# Patient Record
Sex: Female | Born: 1980 | Race: White | Hispanic: No | Smoking: Current every day smoker
Health system: Southern US, Community
[De-identification: ages and names within clinical notes are randomized; demographics above are authoritative.]

## PROBLEM LIST (undated history)

## (undated) DIAGNOSIS — M545 Low back pain: Principal | ICD-10-CM

## (undated) DIAGNOSIS — L409 Psoriasis, unspecified: Secondary | ICD-10-CM

## (undated) DIAGNOSIS — IMO0002 Reserved for concepts with insufficient information to code with codable children: Secondary | ICD-10-CM

## (undated) DIAGNOSIS — L4050 Arthropathic psoriasis, unspecified: Secondary | ICD-10-CM

## (undated) DIAGNOSIS — E669 Obesity, unspecified: Secondary | ICD-10-CM

## (undated) DIAGNOSIS — G8929 Other chronic pain: Principal | ICD-10-CM

## (undated) DIAGNOSIS — F319 Bipolar disorder, unspecified: Secondary | ICD-10-CM

## (undated) DIAGNOSIS — M4690 Unspecified inflammatory spondylopathy, site unspecified: Secondary | ICD-10-CM

## (undated) HISTORY — DX: Other chronic pain: G89.29

## (undated) HISTORY — DX: Bipolar disorder, unspecified: F31.9

## (undated) HISTORY — DX: Obesity, unspecified: E66.9

## (undated) HISTORY — PX: TONSILLECTOMY: SUR1361

## (undated) HISTORY — DX: Psoriasis, unspecified: L40.9

## (undated) HISTORY — DX: Low back pain: M54.5

---

## 2001-07-01 ENCOUNTER — Other Ambulatory Visit: Admission: RE | Admit: 2001-07-01 | Discharge: 2001-07-01 | Payer: Self-pay | Admitting: Obstetrics & Gynecology

## 2004-03-16 ENCOUNTER — Other Ambulatory Visit: Admission: RE | Admit: 2004-03-16 | Discharge: 2004-03-16 | Payer: Self-pay | Admitting: Obstetrics and Gynecology

## 2005-08-08 ENCOUNTER — Other Ambulatory Visit: Admission: RE | Admit: 2005-08-08 | Discharge: 2005-08-08 | Payer: Self-pay | Admitting: Obstetrics and Gynecology

## 2006-04-18 ENCOUNTER — Inpatient Hospital Stay (HOSPITAL_COMMUNITY): Admission: AD | Admit: 2006-04-18 | Discharge: 2006-04-18 | Payer: Self-pay | Admitting: Obstetrics and Gynecology

## 2006-05-22 ENCOUNTER — Inpatient Hospital Stay (HOSPITAL_COMMUNITY): Admission: AD | Admit: 2006-05-22 | Discharge: 2006-05-22 | Payer: Self-pay | Admitting: Obstetrics and Gynecology

## 2006-06-12 ENCOUNTER — Inpatient Hospital Stay (HOSPITAL_COMMUNITY): Admission: AD | Admit: 2006-06-12 | Discharge: 2006-06-17 | Payer: Self-pay | Admitting: Obstetrics and Gynecology

## 2006-06-14 ENCOUNTER — Encounter (INDEPENDENT_AMBULATORY_CARE_PROVIDER_SITE_OTHER): Payer: Self-pay | Admitting: Obstetrics and Gynecology

## 2006-10-09 ENCOUNTER — Emergency Department (HOSPITAL_COMMUNITY): Admission: EM | Admit: 2006-10-09 | Discharge: 2006-10-09 | Payer: Self-pay | Admitting: Emergency Medicine

## 2006-12-27 ENCOUNTER — Ambulatory Visit (HOSPITAL_COMMUNITY): Admission: RE | Admit: 2006-12-27 | Discharge: 2006-12-27 | Payer: Self-pay | Admitting: Obstetrics and Gynecology

## 2007-10-07 ENCOUNTER — Ambulatory Visit: Payer: Self-pay | Admitting: Obstetrics and Gynecology

## 2007-10-07 ENCOUNTER — Encounter: Payer: Self-pay | Admitting: Obstetrics and Gynecology

## 2007-10-23 ENCOUNTER — Ambulatory Visit: Payer: Self-pay | Admitting: Obstetrics & Gynecology

## 2007-12-04 ENCOUNTER — Ambulatory Visit: Payer: Self-pay | Admitting: Obstetrics & Gynecology

## 2008-06-13 IMAGING — US US TRANSVAGINAL NON-OB
1 series · 2 of 2 positions shown · non-contrast
Comparison: none

CLINICAL DATA: Evaluate for fibroids or polyps.  Status post delivery in Wednesday May, 2006 with irregular menses since.  LMP 08/29/06. 
 TRANSABDOMINAL AND TRANSVAGINAL PELVIC ULTRASOUND:
TECHNIQUE: Both transabdominal and transvaginal ultrasound examinations of the pelvis were performed including evaluation of the uterus, ovaries, adnexal regions, and pelvic cul-de-sac.

[Series 1: us transvaginal non-ob · 2 of 2 slices shown]
[im 1/2]
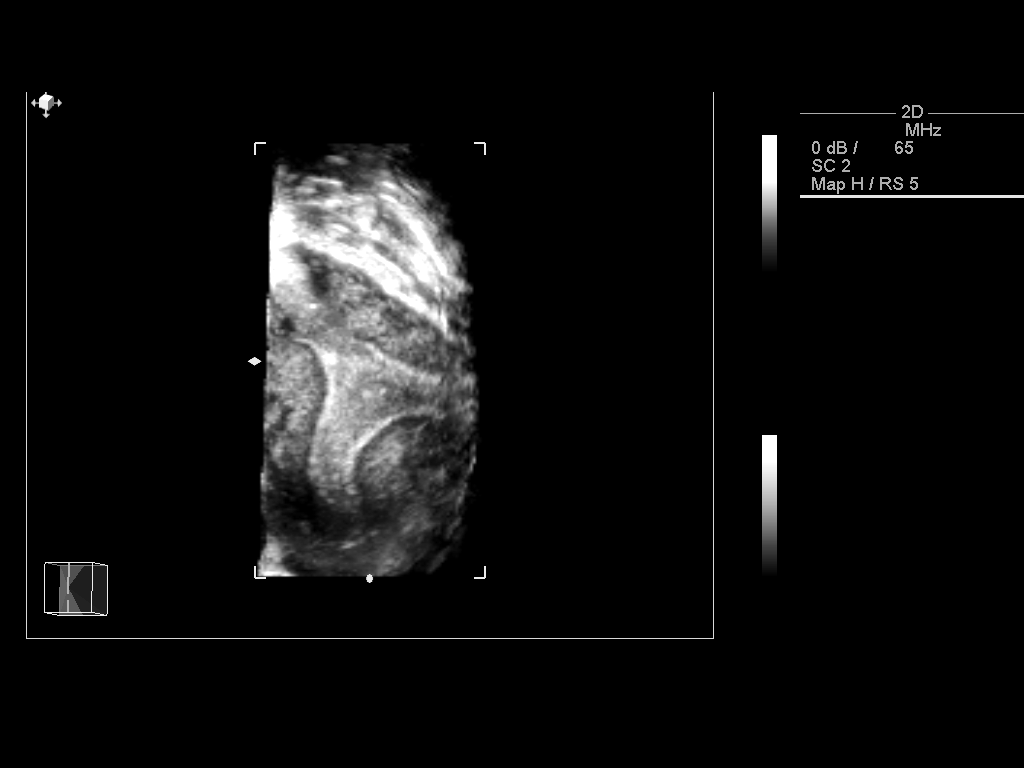
[im 2/2]
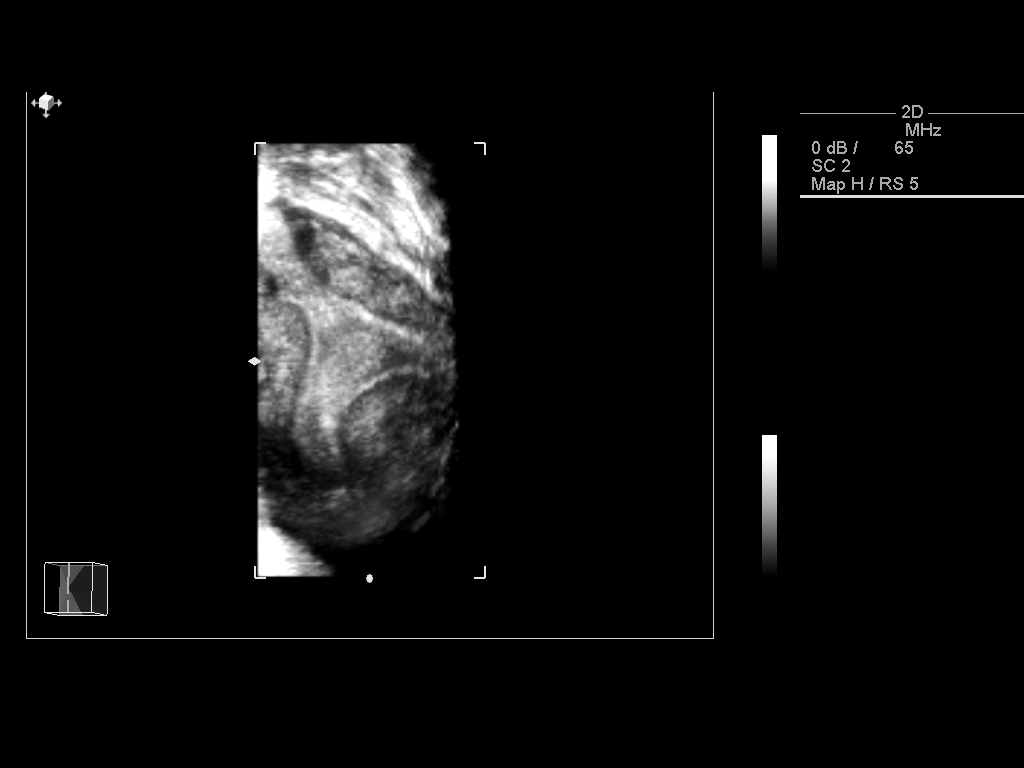

[2 of 2 positions shown; findings below may reference images not displayed]

FINDINGS: Multiple images of the uterus and adnexa were obtained using a transabdominal and endovaginal approach.  The uterus has a sagittal length of 9.7 cm and AP width of 3.8 cm and a transverse width of 6.7 cm.  A homogeneous uterine myometrium is seen.  The endometrial stripe is trilayered with an AP width of 6.8 mm.  This would correlate with a periovulatory endometrial appearance.  Because of the trilayered appearance, good visualization of the entire endometrium was possible.  No areas of focal thickening or signs of areas of increased echogenicity are seen to suggest the possibility of small polyps.  Because of the good visibility endovaginally of the endometrial canal, following discussion with Dr. Sahrul, sonohysterography was deferred.  A small amount of fluid is identified in the endocervical canal.  
 Both ovaries are seen and have a normal appearance with the right ovary measuring 4.1 x 1.9 x 2.9 cm and the left ovary measuring 2.2 x 1.5 x 1.5 cm.  No cul-de-sac or periovarian fluid is seen and no separate adnexal masses are noted.  3D coronal reconstruction of the endometrial canal is performed and reveals a normal appearance of the endometrial canal and fundal contour.
IMPRESSION: Normal pelvic ultrasound with endometrial stripe correlating with a periovulatory appearance.

## 2008-11-04 ENCOUNTER — Encounter: Payer: Self-pay | Admitting: Obstetrics & Gynecology

## 2008-11-04 ENCOUNTER — Ambulatory Visit: Payer: Self-pay | Admitting: Obstetrics & Gynecology

## 2010-04-22 ENCOUNTER — Ambulatory Visit (INDEPENDENT_AMBULATORY_CARE_PROVIDER_SITE_OTHER): Payer: BC Managed Care – PPO

## 2010-04-22 ENCOUNTER — Other Ambulatory Visit: Payer: Self-pay | Admitting: Advanced Practice Midwife

## 2010-04-22 ENCOUNTER — Other Ambulatory Visit: Payer: Self-pay | Admitting: Obstetrics & Gynecology

## 2010-04-22 DIAGNOSIS — N63 Unspecified lump in unspecified breast: Secondary | ICD-10-CM

## 2010-04-22 DIAGNOSIS — Z01419 Encounter for gynecological examination (general) (routine) without abnormal findings: Secondary | ICD-10-CM

## 2010-04-22 DIAGNOSIS — Z1272 Encounter for screening for malignant neoplasm of vagina: Secondary | ICD-10-CM

## 2010-04-26 ENCOUNTER — Ambulatory Visit
Admission: RE | Admit: 2010-04-26 | Discharge: 2010-04-26 | Disposition: A | Discharge status: Home / Self Care | Payer: BC Managed Care – PPO | Source: Ambulatory Visit | Attending: Obstetrics & Gynecology | Admitting: Obstetrics & Gynecology

## 2010-04-26 ENCOUNTER — Other Ambulatory Visit: Payer: Self-pay | Admitting: Obstetrics & Gynecology

## 2010-04-26 ENCOUNTER — Other Ambulatory Visit: Payer: Self-pay | Admitting: Diagnostic Radiology

## 2010-04-26 DIAGNOSIS — N6310 Unspecified lump in the right breast, unspecified quadrant: Secondary | ICD-10-CM

## 2010-04-26 DIAGNOSIS — N63 Unspecified lump in unspecified breast: Secondary | ICD-10-CM

## 2010-04-26 NOTE — Assessment & Plan Note (Unsigned)
[name], CASHATT                ACCOUNT NO.:  [id number]  MEDICAL RECORD NO.:  [id number]          PATIENT TYPE:  LOCATION:  [facility] at [place]           FACILITY:  PHYSICIAN:  Wynelle Bourgeois, CNM    DATE OF BIRTH:  04-30-1980  DATE OF SERVICE:  04/22/2010                                 CLINIC NOTE  This is a 30 year old gravida 4, para 1-0-3-1 who is status post Mirena placement in 2009 and presents today for her annual exam and Pap smear. She has done well with her Mirena but does continue to have regular menstrual flow.  She wants to continue using her Mirena IUD.  She has no other complaints.  PAST MEDICAL HISTORY:  Remarkable for bipolar disorder and psoriasis and psoriatic arthritis.  Her psoriatic arthritis is now being treated with methotrexate and also Remicade infusions with some relief.  PAST SURGICAL HISTORY:  Remarkable for C-section in 2008.  MEDICATIONS:  Lamictal, methotrexate, Cymbalta, and Remicade.  FAMILY HISTORY:  Remarkable for diabetes, heart disease, hypertension, and blood clots.  She also had a grandmother with breast cancer.  SOCIAL HISTORY:  The patient is married.  She does smoke a pack per day. She denies alcohol or drug abuse.  ALLERGIES:  None.  REVIEW OF SYSTEMS:  The patient denies any complaints other than moderately heavy bleeding with her menstrual period.  OBJECTIVE DATA:  VITAL SIGNS:  Pulse 98, blood pressure 128/85, weight 248 which is up 3 pounds from her visit 2 years ago, height 68 inches. CHEST:  Clear. HEART:  Regular rate and rhythm.  Thyroid normal, not enlarged. BREASTS:  Soft, nontender.  There are no masses appreciated, although the patient does complain of intermittent swelling around the right areola, which comes and goes. ABDOMEN:  Obese, soft, and nontender.  No masses are appreciated. EXTREMITIES:  Normal. PELVIC:  EG/BUS within normal limits.  Vagina is clear and well rugated. Cervix is normal, closed, and  long.  Mirena string is present.  Uterus is normal without masses and nontender.  Adnexa are nontender but are not appreciated secondary to habitus.  ASSESSMENT: 1. Annual exam with Pap. 2. Mirena intrauterine (contraceptive) device. 3. Psoriatic arthritis. 4. Bipolar disorder. 5. Smoker. 6. Questionable right breast mass.  PLAN: 1. Pap sent. 2. Declines STD testing. 3. Mammogram and possible ultrasound ordered [facility] to evaluate intermittent right breast swelling for     reassurance and confirmation of no pathology even though exam today     was negative. 4. We will review the ultrasound after it is resulted.          ______________________________ Wynelle Bourgeois, CNM    MW/MEDQ  D:  04/22/2010  T:  04/23/2010  Job:  [id number]

## 2010-04-27 ENCOUNTER — Other Ambulatory Visit: Payer: Self-pay | Admitting: Obstetrics & Gynecology

## 2010-05-17 ENCOUNTER — Ambulatory Visit
Admission: RE | Admit: 2010-05-17 | Discharge: 2010-05-17 | Disposition: A | Discharge status: Home / Self Care | Payer: BC Managed Care – PPO | Source: Ambulatory Visit | Attending: Obstetrics & Gynecology | Admitting: Obstetrics & Gynecology

## 2010-06-07 NOTE — Op Note (Signed)
[name], [name]                ACCOUNT NO.:  [id number]   MEDICAL RECORD NO.:  [id number]          PATIENT TYPE:  INP   LOCATION:  9110                          FACILITY:  [facility]   PHYSICIAN:  Janine Limbo, M.D.DATE OF BIRTH:  [birth year]   DATE OF PROCEDURE:  06/14/2006  DATE OF DISCHARGE:                               OPERATIVE REPORT   PREOPERATIVE DIAGNOSIS:  1. Term intrauterine gestation.  2. Obesity (weight 279 pounds, height 5 feet 8 inches).  3. Pregnancy induced hypertension.  4. Unstable lie.  5. Failure to progress in labor.   POSTOPERATIVE DIAGNOSIS:  1. Term intrauterine gestation.  2. Obesity (weight 279 pounds, height 5 feet 8 inches).  3. Pregnancy induced hypertension.  4. Unstable lie.  5. Failure to progress in labor.  6. Nuchal cord.  7. Single umbilical artery.   PROCEDURE:  Primary low transverse cesarean section.   SURGEON:  Janine Limbo, M.D.   FIRST ASSISTANT:  Rica Koyanagi, C.N.M.   ANESTHESIA:  Spinal.   DISPOSITION:  [name] is a 30 year old female, gravida 3, para 0-0-2-  0, who was admitted to the hospital on Jun 12, 2006, with an increase in  her blood pressure and with an unstable lie.  The patient was scheduled  for cesarean section because of a transverse presentation, however, she  was found to be vertex when she was prepared to go to the operating  room.  Induction of labor was attempted on Jun 12, 2006, and Jun 13, 2006.  On Jun 14, 2006, the patient had become quite fatigued of the  induction process.  We discussed our management options.  Those options  included discharge to home, in hospital observation, continued induction  of labor, and cesarean delivery.  The risks and benefits of each of  those options were discussed.  I have recommended that the patient  simply be discharged to home and then be followed closely as an  outpatient.  The patient declined that recommendation.  She elected  instead to  proceed with cesarean delivery.  The patient's husband and  other family members agreed.  The specific risks of cesarean delivery  were reviewed including, but not limited to, anesthetic complications,  bleeding, infection, and possible damage to surrounding organs.   FINDINGS:  A 6 pounds 4 ounce female infant Patent attorney) was delivered  from an occiput anterior presentation.  The Apgars were 9 at 1 minute  and 9 at 5 minutes.  A two vessel umbilical cord was noted.  The  placenta was sent to pathology.  The uterus, fallopian tubes, and  ovaries were normal for the gravid state.   DESCRIPTION OF PROCEDURE:  The patient was taken to the operating room  where a spinal anesthetic was given.  The patient's abdomen, perineum,  and vagina were prepped with multiple layers of Betadine.  A Foley  catheter was placed in the bladder.  The patient was sterilely draped.  The lower abdomen was injected with 10 mL of 0.5% Marcaine with  epinephrine.  A low transverse incision was made in the abdomen  and  carried sharply through the subcutaneous tissue, fascia, and the  anterior peritoneum.  The bladder flap was developed.  An incision was  made in the lower uterine segment and extended in a low transverse  fashion.  The fetal head was delivered.  The mouth and nose were  suctioned.  The remainder of the infant was delivered after the nuchal  cord was reduced.  The cord was clamped and cut and the infant was  handed to the awaiting pediatric team.  Routine cord blood studies were  obtained.   The placenta was removed.  The placenta was sent to pathology.  The  uterine cavity was cleaned of amniotic fluid, clotted blood, and  membranes.  The uterine incision was closed using a running locking  suture of 2-0 Vicryl followed by an imbricating suture of 2-0 Vicryl.  Hemostasis was adequate.  The pelvis was vigorously irrigated.  Again,  hemostasis was adequate.  The anterior peritoneum and abdominal   musculature were reapproximated in the midline using 2-0 Vicryl.  The  fascia was closed using a running suture of 0 Vicryl followed by three  interrupted sutures of 0 Vicryl.  A Jackson-Pratt drain was placed in  the subcutaneous layer and brought out through the left lower quadrant.  It was sutured into place using 3-0 silk.  The subcutaneous layer was  closed using a running suture of 2-0 Vicryl.  The skin was  reapproximated using a subcuticular suture of 3-0 Monocryl.  Sponge,  needle, instrument counts were correct on two occasions.  Estimated  blood loss for the procedure was 1000 mL.  The patient tolerated the  procedure well.  The patient was taken to the recovery room in stable  condition.  She was noted to drain clear yellow urine.  The infant was  taken to the full term nursery in stable condition.      Janine Limbo, M.D.  Electronically Signed     AVS/MEDQ  D:  06/14/2006  T:  06/14/2006  Job:  [id number]

## 2010-06-07 NOTE — Assessment & Plan Note (Signed)
NAMERANDI, POULLARD                ACCOUNT NO.:  [id number]   MEDICAL RECORD NO.:  [id number]          PATIENT TYPE:  POB   LOCATION:  [facility] [place]         FACILITY:  [facility]   PHYSICIAN:  Scheryl Darter, MD       DATE OF BIRTH:  04-14-1980   DATE OF SERVICE:                                  CLINIC NOTE   The patient comes today for yearly exam.  The patient is a 30 year old  white female, gravida 4, para 1-0-3-1, who had Mirena placed in  September 2009.  She says she has regular light menses, but she spots in  between periods.  This is persisted since the insertion of the Mirena.  She wishes to continue using Mirena.  She does have to wear a pad  because of the spotting.  No complaints of pain.   PAST MEDICAL HISTORY:  Bipolar disorder, psoriasis.   PAST SURGICAL HISTORY:  Cesarean section in 2008.   MEDICATIONS:  1. Lamictal.  2. Xanax 0.25 mg p.r.n.  3. Mirena IUD in place.  4. Methotrexate 2.5 mg daily prescribed for psoriasis.   FAMILY HISTORY:  Diabetes, coronary heart disease, hypertension and  blood clots.  She says her grandmother had breast cancer.   SOCIAL HISTORY:  The patient is married.  She smokes about a pack of  cigarettes a day.  She quit during pregnancy, but started again.  She  denies alcohol or drug use.   ALLERGIES:  No known drug allergies.   REVIEW OF SYSTEMS:  The patient's weight is stable.  She denies any  chest complaints, abdominal pain, bowel disorders, dysuria, abnormal  vaginal discharge.  She does have some spotting.   PHYSICAL EXAMINATION:  GENERAL:  The patient is in no acute distress and  affect is normal.  VITAL SIGNS:  Weight is 228 pounds, height is 68 inches, blood pressure  112/76, pulse is 93.  CHEST:  Clear.  HEART:  Regular rate and rhythm.  HEENT:  No thyromegaly.  BREASTS:  Symmetric.  No mass, tenderness or lymphadenopathy.  ABDOMEN:  Obese, soft, nontender, no mass.  EXTREMITIES:  No swelling.  PELVIC:  External  genitalia, vagina and cervix notable for some brownish  blood-stained mucus and Mirena string seen, appear to be about 3-4 cm  long.  Pap was obtained.  Uterus is normal size, nontender and IUD was  not palpable at the os.  There are no masses.   IMPRESSION:  1. Intermenstrual spotting with Mirena in place.  The patient wish to      continue using the Mirena  2. History of bipolar disorder.   PLAN:  The patient to return for yearly examination.  I advised her to  quit smoking.      Scheryl Darter, MD     JA/MEDQ  D:  11/04/2008  T:  11/05/2008  Job:  [id number]

## 2010-06-07 NOTE — H&P (Signed)
NAMECOPELYN, WIDMER                ACCOUNT NO.:  [id number]   MEDICAL RECORD NO.:  [id number]          PATIENT TYPE:  INP   LOCATION:  9199                          FACILITY:  [facility]   PHYSICIAN:  Osborn Coho, M.D.   DATE OF BIRTH:  1980/05/16   DATE OF ADMISSION:  06/12/2006  DATE OF DISCHARGE:                              HISTORY & PHYSICAL   [name] is a 30 year old gravida 3 para 0-0-2-0, who presents at 58-  1/2 weeks for scheduled primary C-section secondary to transverse lie,  elevated blood pressures, with worsening headaches.  Her pregnancy has  been followed by the C[facility] service at [facility] and is remarkable for:  1. Depression.  2. Migraines.  3. History of HSV II.  4. Previous smoker.  5. Obesity.   [name] began prenatal care at the office of [facility] on November 16, 2005  at approximately [redacted] weeks gestation.  EDC determined by dates and  confirmed with first trimester ultrasound.  She had first trimester  screen, which was within normal limits.  Her pregnancy has been  complicated with some depression.  This was discussed at [facility], and the  patient did not feel the need to pursue counseling or medication at that  time.  However, these measures were offered to her.  In the third  trimester, the patient has developed elevated labile blood pressures.  Her PIH labs have all remained within normal limits.  She has not had  any evidence of protein in her urine.  However, she has had worsening  headaches in the third trimester.  She began prenatal care with a height  of 5 feet 8 inches and a weight of 220 pounds.  Her last recorded  pregnancy weight was 279.  She has no known drug allergies.  She denies  the use of tobacco, alcohol, or illicit drugs.  She smoked cigarettes  prior to pregnancy and quit with positive EPT.   OB HISTORY:  In 2002, the patient had a first trimester SAB with no  complications. In 2003, the patient had a first trimester SAB with no  complications.   This is her third and current pregnancy.   MEDICAL HISTORY:  1. Depression. Suicide attempt in 2003.  2. History of migraines.  3. History of HSV II.  4. Obesity.   SURGICAL HISTORY:  Tonsils.   FAMILY HISTORY:  Patient's father with a history of heart disease and  chronic hypertension.  Patient's father with a history of diabetes as  well.  Maternal grandmother with a history of thyroid disease.  Maternal  grandmother and maternal aunt with a history of breast cancer.   GENETIC HISTORY:  There is no genetic history of familial or chromosomal  disorders, children that were born with any birth defects, or any that  died in infancy.   SOCIAL HISTORY:  [name] is a married Caucasian female.  She works as  a Diplomatic Services operational officer.  Her husband works in Engineering geologist.  They do not subscribed to a  religious faith.   REVIEW OF SYSTEMS:  As described above.  The patient presents  at term  with a transverse lie, elevated blood pressures, and worsening headaches  for primary low transverse cesarean section.  The patient was offered  external cephalic version by Dr. Osborn Coho, and this was declined.  The patient opted to proceed with primary C-section.   PHYSICAL EXAMINATION:  VITAL SIGNS: Stable, afebrile.  HEENT:  Unremarkable.  HEART:  Regular rate and rhythm.  LUNGS:  Clear.  ABDOMEN:  Gravid in its contour.  Uterine fundus is noted to extend 40  cm above the level of the pubic symphysis. Infant is noted to be in a  transverse lie per ultrasound examination.  Fetal heart rate is  reassuring.  EXTREMITIES:  Show 1+ edema.  DTRs are 1+, with no clonus.  There is no  calf tenderness noted bilaterally.   ASSESSMENT:  Intrauterine pregnancy at 37-1/2 weeks primary low  transverse c-section planned for transverse lie, elevated blood  pressures, and worsening headache.   PLAN:  Admit with orders as written per Dr. Osborn Coho for scheduled  cesarean delivery on Jun 12, 2006.      Rica Koyanagi, C.N.M.      Osborn Coho, M.D.  Electronically Signed    SDM/MEDQ  D:  06/12/2006  T:  06/12/2006  Job:  [id number]

## 2010-06-07 NOTE — Discharge Summary (Signed)
[name], [name]                ACCOUNT NO.:  [id number]   MEDICAL RECORD NO.:  [id number]          PATIENT TYPE:  INP   LOCATION:  9110                          FACILITY:  [facility]   PHYSICIAN:  Hal Morales, M.D.DATE OF BIRTH:  Sep 19, 1980   DATE OF ADMISSION:  06/12/2006  DATE OF DISCHARGE:  06/17/2006                               DISCHARGE SUMMARY   ADMISSION DIAGNOSES:  1. Intrauterine pregnancy at 37-1/2 weeks.  2. Transverse lie.  3. Elevated blood pressures.  4. Worsening headache.  5. Obesity.   PREOPERATIVE DIAGNOSES:  1. Term pregnancy.  2. Obesity.  3. Pregnancy induced hypertension.  4. Unstable lie.  5. Failure to progress.   PROCEDURE:  Primary low transverse Cesarean section.   DISCHARGE DIAGNOSES:  1. Intrauterine pregnancy at term, delivered.  2. Obesity.  3. Pregnancy induced hypertension.  4. Unstable lie.  5. Failure to progress.  6. Primary low transverse Cesarean section.  7. Nuchal cord and single umbilical artery.   HISTORY OF PRESENT ILLNESS:  [name] was scheduled for primary low  transverse Cesarean section on Jun 12, 2006 with transverse lie. The  baby was found to be in a cephalic presentation on admission. She was  therefore admitted for induction of labor. Her cervix remained 1 cm  dilated, thick, and long with the cephalic presenting part at a minus 3  station. She received Cytotec and Pitocin and did not progress any  further in her dilation over a 2 day prior of time. On Jun 14, 2006, the  patient expressed her exhaustion with the entire process and related  that she did not want to go home without having a baby. Dr. Marline Backbone entered into discussion with the patient regarding her  management options. These included discharge to home, in hospital  observation, continued induction and Cesarean section. He reviewed the  risks and benefits of each of these options and the patient opted to  proceed with primary low  transverse Cesarean section. Her vital signs  remained stable in labor. She was afebrile. Her PIH lab evaluation was  all within normal limits. She had a 24 hour urine, which was completed  on Jun 14, 2006 and showed 297 mg of protein.   HOSPITAL COURSE:  On Jun 14, 2006, she underwent primary low transverse  Cesarean section with Dr. Marline Backbone as surgeon. The patient gave  birth to a 6 pound 4 ounce female infant named Scarlet with Apgar scores  of 9 at 1 minute and 9 at 5 minutes. The patient has remained afebrile  in the postoperative period. Her weight has declined since admission.  She was 136 kg. Today on discharge, she is 128 kg. She has received HCTZ  25 mg p.o. daily and her blood pressures have remained normotensive. Her  hemoglobin on the first postoperative day was 9.0.   DISPOSITION:  In light of patient's stable condition, she is to be  discharged home.   DISCHARGE MEDICATIONS:  1. Motrin 600 mg p.o. q.6 hours p.r.n. pain.  2. Tylox 1 to 2 p.o. q.3 to 4 hours  p.r.n. pain.  3. Prenatal vitamins.  4. She will also continue her HCTZ 25 mg p.o. daily.  5. Add K-Dur 10 meq p.o. daily.   DISCHARGE INSTRUCTIONS:  She has been counseled regarding postpartum  depression and declines medication for this at the present time. She  will call for any feelings of sadness or depression, any PIH symptoms,  or any problems or concerns. The [facility] nurse will evaluate her  blood pressure at home on Wednesday or Thursday, this week and call  those results to the office of [facility].      Rica Koyanagi, C.N.M.      Hal Morales, M.D.  Electronically Signed    SDM/MEDQ  D:  06/17/2006  T:  06/17/2006  Job:  [id number]

## 2010-06-07 NOTE — Assessment & Plan Note (Signed)
[name], [name]                ACCOUNT NO.:  [id number]   MEDICAL RECORD NO.:  [id number]          PATIENT TYPE:  POB   LOCATION:  [facility] [place]         FACILITY:  [facility]   PHYSICIAN:  Elsie Lincoln, MD      DATE OF BIRTH:  Feb 04, 1980   DATE OF SERVICE:  10/23/2007                                  CLINIC NOTE   The patient is a 30 year old para 1-0-3-1 who presents for IUD  insertion.  She is currently on her period.  She was consented for a  Mirena IUD insertion.  She understands the risks including, but not  limited to bleeding, infection, damage to the back of the uterus.  There  is also a failure rate as well as increased risk of ectopics.  GC and  chlamydia cultures were done prior to the insertion today.   PROCEDURE NOTE:  After informed consent was obtained, the patient was  placed in dorsal lithotomy position and the speculum was placed in to  the vagina.  The cervix was brought into view and grasped with single-  tooth tenaculum.  The uterus was already noted to be in mid position,  slightly anteverted.  The uterus was then sounded to 9 cm.  The Mirena  was placed without problem.  The strings were cut to 2 cm.  The patient  was tolerated the procedure well.  The patient was instructed to do  string checks and also to come back to our office in 6 weeks for string  check.           ______________________________  Elsie Lincoln, MD     KL/MEDQ  D:  10/23/2007  T:  10/24/2007  Job:  [id number]

## 2010-06-07 NOTE — Assessment & Plan Note (Signed)
NAMETYRESE, FICEK                ACCOUNT NO.:  [id number]   MEDICAL RECORD NO.:  [id number]          PATIENT TYPE:  POB   LOCATION:  [facility] at [place]         FACILITY:  [facility]   PHYSICIAN:  Caren Griffins, CNM       DATE OF BIRTH:  09/22/1980   DATE OF SERVICE:                                  CLINIC NOTE   REASON FOR VISIT:  Annual GYN.   HISTORY:  This is a 30 year old G4, P 1-0-3-1 who is here for annual  visit, Pap smear and also needing refill on her Nortrel contraception.  She has no concerns.  She states she is doing well with Weight Watchers  and has lost 32 pounds from her prepregnancy weight.  Her daughter was  born in May 2008.  She had a C-section for a transverse lie and had  hypertension and pregnancy, but has never been diagnosed with chronic  hypertension.   ALLERGIES:  None.   CURRENT MEDICATIONS:  Lamictal, Abilify, Xanax, Nortrel and p.r.n.  Valtrex.  She gets her psych meds from [facility] and  states she is doing very well with her bipolar disorder and is  asymptomatic and fully functional.   IMMUNIZATIONS:  Usual childhood immunizations and has had flu and  tetanus.   MENSTRUAL HISTORY:  10, x28, x10 with duration decrease to 4-5 days  since she has been on contraception.  Prior to that she had very heavy  periods, now she has moderate flow with a mild dysmenorrhea.  No  intermenstrual bleeding or spotting.  She does not desire pregnancy for  many years.  She has been on Ortho Tri-Cyclen or Nortrel continuously  since age 67.   OBSTETRICAL HISTORY:  Significant for C-section, 2 SABs and 1 tubal  ectopic.   GYNECOLOGICAL HISTORY:  Last Pap was negative in September 2008 and all  previous ones have been normal.  She denies ever having had any STIs and  declines any testing for STIs today.  However, she does have a history  of having genital herpes and last outbreak was about 2 years ago.   SURGERY:  C-section.   FAMILY HISTORY:   Significant for father is insulin-dependent diabetic,  has had an MI and has hypertension and has some type of blood clots.  Grandmother has cancer and unsure kind.   PAST MEDICAL HISTORY:  Significant for bipolar disorder, otherwise  negative.   SOCIAL HISTORY:  Lives with her spouse and daughter.  The spouse is  unemployed and cares for their young daughter full-time.  She works in  [employer], sedentary job.  She has smoked about a pack a day for many  years.  She does not drink alcohol.  Drinks several caffeinated  beverages a day.  She has never used IV drugs or been sexually abused.   REVIEW OF SYSTEMS:  10-point is negative.  She is happy with her sexual  relations and denies having any irritative vaginal symptoms at present.   PHYSICAL EXAMINATION:  VITAL SIGNS:  BMI is 37 and normal with weight of  225, BP is 137/91, recheck is pending, pulse 87.  GENERAL:  Pleasant WF and NAD.  HEENT:  Good dentition.  NECK:  Thyroid not enlarged.  Smooth.  CORONARY:  RRR without murmur.  LUNGS:  Clear to auscultation bilaterally.  BREASTS:  Pendulous.  Nipples flat, no discrete masses.  BSE is  explained during exam.  No lymphadenopathy.  ABDOMEN:  Obese, nontender.  No organomegaly.  Pfannenstiel scar.  EXTREMITIES:  No edema.  Pulses full and equal bilaterally.  PELVIC:  NEFG.  Good tone and support.  Vagina, well rugated and pink.  Mucousy discharge.  Cervix, without lesions.  Bimanual, uterus and NSSP.  No adnexal tenderness or masses.   ASSESSMENT:  Normal gynecological examination, needs contraception,  obesity, bipolar, heavy smoker.   PLAN:  Discussed smoking cessation.  However, she is not highly  motivated at this time.  She is planning to continue with Weight  Watchers and happy with her success there.  We did talk about diet and  increasing her walking and exercise since she has a sedentary job.  Discussed at length the options for contraception and in her case with   being a heavy smoker and using hormonal combination contraception for  many years with a borderline BP elevation today.  Decided that Mirena  would be a better choice for her and she elects to go ahead and have a  Mirena placed during her next menses.  We did discuss other  contraceptive methods also.  Pap smear was sent.  She declined  GC/chlamydia and we did obtain a baseline CBC and UA.           ______________________________  Caren Griffins, CNM     DP/MEDQ  D:  10/07/2007  T:  10/08/2007  Job:  [id number]

## 2012-08-08 ENCOUNTER — Encounter: Payer: Self-pay | Admitting: *Deleted

## 2012-08-08 ENCOUNTER — Emergency Department
Admission: EM | Admit: 2012-08-08 | Discharge: 2012-08-08 | Disposition: A | Discharge status: Home / Self Care | Payer: Medicaid Other | Source: Home / Self Care | Attending: Family Medicine | Admitting: Family Medicine

## 2012-08-08 DIAGNOSIS — M545 Low back pain, unspecified: Secondary | ICD-10-CM

## 2012-08-08 HISTORY — DX: Arthropathic psoriasis, unspecified: L40.50

## 2012-08-08 HISTORY — DX: Reserved for concepts with insufficient information to code with codable children: IMO0002

## 2012-08-08 HISTORY — DX: Unspecified inflammatory spondylopathy, site unspecified: M46.90

## 2012-08-08 MED ORDER — METHYLPREDNISOLONE ACETATE 80 MG/ML IJ SUSP
80.0000 mg | Freq: Once | INTRAMUSCULAR | Status: AC
  Administered 2012-08-08: 80 mg via INTRAMUSCULAR

## 2012-08-08 NOTE — ED Provider Notes (Signed)
History    CSN: [id number] Arrival date & time 08/08/12  1347  First MD Initiated Contact with Patient 08/08/12 1413     Chief Complaint  Patient presents with  . Back Pain     HPI Comments: Patient complains of onset of right low back pain radiating to her right lateral leg 4 days ago.  The pain began after she developed a stomach virus and remained in bed for 3 days.  Her pain is worse when sitting, walking, and supine on her back.  No bowel or bladder dysfunction.  No saddle numbness.  Aleve is somewhat helpful. She has a past history of herniated disc at L5-S1 requiring steroid injection and PT two years ago.  She has a follow-up appt with neurologist Dr. Gerrit Halls on July 28  Patient is a 32 y.o. female presenting with back pain. The history is provided by the patient.  Back Pain Location:  Lumbar spine and gluteal region Quality:  Aching and shooting Radiates to:  R posterior upper leg Pain severity:  Moderate Pain is:  Same all the time Onset quality:  Gradual Duration:  4 days Timing:  Constant Progression:  Unchanged Chronicity:  Recurrent Context: recent illness   Relieved by:  NSAIDs Worsened by:  Sitting, standing and lying down Associated symptoms: paresthesias   Associated symptoms: no abdominal pain, no bladder incontinence, no bowel incontinence, no dysuria, no fever, no headaches, no pelvic pain, no perianal numbness, no tingling, no weakness and no weight loss   Risk factors: obesity    Past Medical History  Diagnosis Date  . Psoriatic arthritis   . Spondylitis   . Herniated disc    Past Surgical History  Procedure Laterality Date  . Cesarean section    . Tonsillectomy     Family History  Problem Relation Age of Onset  . Osteoarthritis Mother   . Diabetes Father    History  Substance Use Topics  . Smoking status: Current Every Day Smoker -- 1.00 packs/day    Types: Cigarettes  . Smokeless tobacco: Never Used  . Alcohol Use: No   OB History    Grav Para Term Preterm Abortions TAB SAB Ect Mult Living                 Review of Systems  Constitutional: Negative for fever and weight loss.  Gastrointestinal: Negative for abdominal pain and bowel incontinence.  Genitourinary: Negative for bladder incontinence, dysuria and pelvic pain.  Musculoskeletal: Positive for back pain.  Neurological: Positive for paresthesias. Negative for tingling, weakness and headaches.  All other systems reviewed and are negative.    Allergies  Tramadol  Home Medications   Current Outpatient Rx  Name  Route  Sig  Dispense  Refill  . Apremilast (OTEZLA) 30 MG TABS   Oral   Take 60 mg by mouth.         . DULoxetine (CYMBALTA) 60 MG capsule   Oral   Take 120 mg by mouth daily.         . folic acid (FOLVITE) 1 MG tablet   Oral   Take 1 mg by mouth daily.         . hydroxychloroquine (PLAQUENIL) 200 MG tablet   Oral   Take by mouth daily.         Marland Kitchen lamoTRIgine (LAMICTAL) 200 MG tablet   Oral   Take 400 mg by mouth daily.         . methocarbamol (ROBAXIN)  500 MG tablet   Oral   Take 500 mg by mouth 4 (four) times daily.         . methotrexate (RHEUMATREX) 2.5 MG tablet   Oral   Take 7.5 mg by mouth 3 (three) times a week.          BP 126/85  Pulse 101  Resp 16  Ht 5\' 8"  (1.727 m)  Wt 253 lb (114.76 kg)  BMI 38.48 kg/m2  SpO2 98% Physical Exam Nursing notes and Vital Signs reviewed. Appearance:  Patient appears stated age, and in no acute distress.  She moves to exam table slowly and deliberately however.  Patient is obese (BMI 38.5) Eyes:  Pupils are equal, round, and reactive to light and accomodation.  Extraocular movement is intact.  Conjunctivae are not inflamed  Pharynx:  Normal Neck:  Supple.   No adenopathy Lungs:  Clear to auscultation.  Breath sounds are equal.  Heart:  Regular rate and rhythm without murmurs, rubs, or gallops.  Abdomen:  Nontender without masses or hepatosplenomegaly.  Bowel sounds  are present.  No CVA or flank tenderness.  Extremities:  No edema.  No calf tenderness Skin:  No rash present.  Back:  Decreased range of motion. Can heel/toe walk and squat without difficulty.  Tenderness in the midline and right paraspinous muscles from L4 to Sacral area.  Straight leg raising test is positive at 30 degrees.  Sitting knee extension test is negative.  Strength and sensation in the lower extremities is normal.  Patellar and achilles reflexes are normal   ED Course  Procedures  none   1. Right low back pain     MDM  DepoMedrol 80mg  IM Apply ice pack several times daily.  Begin back stretching exercises Followup with neurologist as scheduled  Lattie Haw, MD 08/08/12 1610

## 2012-08-08 NOTE — ED Notes (Signed)
Aidan c/o low back pain, radiating down her right leg with numbness/tingling x 4 days. Without injury. She was in bed for 3 days recently with a stomach virus.

## 2012-08-10 ENCOUNTER — Telehealth: Payer: Self-pay | Admitting: Emergency Medicine

## 2013-12-25 ENCOUNTER — Ambulatory Visit (INDEPENDENT_AMBULATORY_CARE_PROVIDER_SITE_OTHER): Payer: Medicare Other | Admitting: Neurology

## 2013-12-25 ENCOUNTER — Encounter: Payer: Self-pay | Admitting: Neurology

## 2013-12-25 VITALS — BP 131/89 | HR 84 | Ht 68.0 in | Wt 255.0 lb

## 2013-12-25 DIAGNOSIS — G8929 Other chronic pain: Secondary | ICD-10-CM

## 2013-12-25 DIAGNOSIS — M545 Low back pain, unspecified: Secondary | ICD-10-CM | POA: Insufficient documentation

## 2013-12-25 HISTORY — DX: Low back pain, unspecified: M54.50

## 2013-12-25 HISTORY — DX: Other chronic pain: G89.29

## 2013-12-25 MED ORDER — HYDROCODONE-ACETAMINOPHEN 7.5-325 MG PO TABS: 1.0000 | ORAL_TABLET | Freq: Four times a day (QID) | ORAL | Status: AC | PRN

## 2013-12-25 MED ORDER — MELOXICAM 15 MG PO TABS: 15.0000 mg | ORAL_TABLET | Freq: Every day | ORAL | Status: AC

## 2013-12-25 NOTE — Progress Notes (Signed)
Reason for visit: Low back pain  Lindsey SabinWendy K Chandler is a 33 y.o. female  History of present illness:  [name] is a 33 year old white female with a history of obesity and psoriatic arthritis. The patient has been on treatments for her psoriatic arthritis with good benefit with the exception that she continues to have low back and some neck discomfort. The patient has had back pain for greater than 10 years. The patient has undergone physical therapy without much benefit. She has undergone epidural steroid injections with transient improvement for about 5 days. She has had MRI evaluation of the low back, but she indicates that it has been greater than 5 years since her last study. She has developed sciatica type pain down the right leg to the foot. She denies any numbness or weakness in the legs. She has bilateral hip pain. She denies any significant balance issues or problems controlling the bowels or the bladder. She may have some occasional tingling in the toes. The patient indicates that prolonged standing or walking will increase the discomfort. She comes to this office for further evaluation.  Past Medical History  Diagnosis Date  . Psoriatic arthritis   . Spondylitis   . Herniated disc   . Psoriasis   . Bipolar 1 disorder   . Chronic low back pain 12/25/2013  . Obesity     Past Surgical History  Procedure Laterality Date  . Cesarean section    . Tonsillectomy      Family History  Problem Relation Age of Onset  . Osteoarthritis Mother   . Diabetes Father   . Healthy Sister   . Healthy Brother     Social history:  reports that she has been smoking Cigarettes.  She has been smoking about 1.00 pack per day. She has never used smokeless tobacco. She reports that she does not drink alcohol or use illicit drugs.  Medications:  Current Outpatient Prescriptions on File Prior to Visit  Medication Sig Dispense Refill  . DULoxetine (CYMBALTA) 60 MG capsule Take 120 mg by mouth daily.     . folic acid (FOLVITE) 1 MG tablet Take 1 mg by mouth daily.    . hydroxychloroquine (PLAQUENIL) 200 MG tablet Take by mouth daily.    Marland Kitchen. lamoTRIgine (LAMICTAL) 200 MG tablet Take 400 mg by mouth daily.    . methotrexate (RHEUMATREX) 2.5 MG tablet Take 7.5 mg by mouth 3 (three) times a week.     No current facility-administered medications on file prior to visit.      Allergies  Allergen Reactions  . Tramadol Itching    ROS:  Out of a complete 14 system review of symptoms, the patient complains only of the following symptoms, and all other reviewed systems are negative.  Joint pain, joint swelling, aching muscles Allergies Headache Depression, anxiety, not enough sleep, decreased energy Insomnia, restless legs  Blood pressure 131/89, pulse 84, height 5\' 8"  (1.727 m), weight 255 lb (115.667 kg).  Physical Exam  General: The patient is alert and cooperative at the time of the examination. The patient is markedly obese.  Eyes: Pupils are equal, round, and reactive to light. Discs are flat bilaterally.  Neck: The neck is supple, no carotid bruits are noted.  Respiratory: The respiratory examination is clear.  Cardiovascular: The cardiovascular examination reveals a regular rate and rhythm, no obvious murmurs or rubs are noted.  Neuromuscular: The patient has good range of movement of the low back, but she reports pain. There  is some discomfort with palpation of the low back. No significant discomfort with internal and external rotation of the hips is noted.  Skin: Extremities are without significant edema.  Neurologic Exam  Mental status: The patient is alert and oriented x 3 at the time of the examination. The patient has apparent normal recent and remote memory, with an apparently normal attention span and concentration ability.  Cranial nerves: Facial symmetry is present. There is good sensation of the face to pinprick and soft touch bilaterally. The strength of the  facial muscles and the muscles to head turning and shoulder shrug are normal bilaterally. Speech is well enunciated, no aphasia or dysarthria is noted. Extraocular movements are full. Visual fields are full. The tongue is midline, and the patient has symmetric elevation of the soft palate. No obvious hearing deficits are noted.  Motor: The motor testing reveals 5 over 5 strength of all 4 extremities. The patient is able to walk on heels and the toes bilaterally. Good symmetric motor tone is noted throughout.  Sensory: Sensory testing is intact to pinprick, soft touch, vibration sensation, and position sense on all 4 extremities. No evidence of extinction is noted.  Coordination: Cerebellar testing reveals good finger-nose-finger and heel-to-shin bilaterally.  Gait and station: Gait is normal. Tandem gait is normal. Romberg is negative. No drift is seen.  Reflexes: Deep tendon reflexes are symmetric and normal bilaterally. Toes are downgoing bilaterally.   Assessment/Plan:  1. Chronic low back pain  2. Psoriatic arthritis  3. Obesity  The patient is having chronic low back pain that has worsened over time. She also reports sciatica type pain down the right leg. The patient will be sent for MRI evaluation of the low back, and she will be placed on Mobic 15 mg daily. I have recommended that she engage in exercise, possibly water aerobics to help her loose weight. If the MRI of the lumbosacral spine is relative the unremarkable, chiropractic therapy may be helpful, particularly with lumbar traction. The patient may be a candidate for a TENS unit in the future. She will follow-up in 3-4 months. I have indicated that she needs to lose weight to improve the low back pain. The patient is also reporting degenerative arthritis involving the knees, once again related to her excessive weight.  Marlan Palau. Keith Willis MD 12/25/2013 8:11 PM  [facility] [address] [place], [place] [id number]  Phone [phone] Fax [phone]

## 2013-12-25 NOTE — Patient Instructions (Signed)
Back Pain, Adult Low back pain is very common. About 1 in 5 people have back pain.The cause of low back pain is rarely dangerous. The pain often gets better over time.About half of people with a sudden onset of back pain feel better in just 2 weeks. About 8 in 10 people feel better by 6 weeks.  CAUSES Some common causes of back pain include:  Strain of the muscles or ligaments supporting the spine.  Wear and tear (degeneration) of the spinal discs.  Arthritis.  Direct injury to the back. DIAGNOSIS Most of the time, the direct cause of low back pain is not known.However, back pain can be treated effectively even when the exact cause of the pain is unknown.Answering your caregiver's questions about your overall health and symptoms is one of the most accurate ways to make sure the cause of your pain is not dangerous. If your caregiver needs more information, he or she may order lab work or imaging tests (X-rays or MRIs).However, even if imaging tests show changes in your back, this usually does not require surgery. HOME CARE INSTRUCTIONS For many people, back pain returns.Since low back pain is rarely dangerous, it is often a condition that people can learn to manageon their own.   Remain active. It is stressful on the back to sit or stand in one place. Do not sit, drive, or stand in one place for more than 30 minutes at a time. Take short walks on level surfaces as soon as pain allows.Try to increase the length of time you walk each day.  Do not stay in bed.Resting more than 1 or 2 days can delay your recovery.  Do not avoid exercise or work.Your body is made to move.It is not dangerous to be active, even though your back may hurt.Your back will likely heal faster if you return to being active before your pain is gone.  Pay attention to your body when you bend and lift. Many people have less discomfortwhen lifting if they bend their knees, keep the load close to their bodies,and  avoid twisting. Often, the most comfortable positions are those that put less stress on your recovering back.  Find a comfortable position to sleep. Use a firm mattress and lie on your side with your knees slightly bent. If you lie on your back, put a pillow under your knees.  Only take over-the-counter or prescription medicines as directed by your caregiver. Over-the-counter medicines to reduce pain and inflammation are often the most helpful.Your caregiver may prescribe muscle relaxant drugs.These medicines help dull your pain so you can more quickly return to your normal activities and healthy exercise.  Put ice on the injured area.  Put ice in a plastic bag.  Place a towel between your skin and the bag.  Leave the ice on for 15-20 minutes, 03-04 times a day for the first 2 to 3 days. After that, ice and heat may be alternated to reduce pain and spasms.  Ask your caregiver about trying back exercises and gentle massage. This may be of some benefit.  Avoid feeling anxious or stressed.Stress increases muscle tension and can worsen back pain.It is important to recognize when you are anxious or stressed and learn ways to manage it.Exercise is a great option. SEEK MEDICAL CARE IF:  You have pain that is not relieved with rest or medicine.  You have pain that does not improve in 1 week.  You have new symptoms.  You are generally not feeling well. SEEK   IMMEDIATE MEDICAL CARE IF:   You have pain that radiates from your back into your legs.  You develop new bowel or bladder control problems.  You have unusual weakness or numbness in your arms or legs.  You develop nausea or vomiting.  You develop abdominal pain.  You feel faint. Document Released: 01/09/2005 Document Revised: 07/11/2011 Document Reviewed: 05/13/2013 Exit[employer] Patient Information 2015 [employer]. This information is not intended to replace advice given to you by your health care provider. Make sure you  discuss any questions you have with your health care provider.  

## 2014-01-13 ENCOUNTER — Ambulatory Visit
Admission: RE | Admit: 2014-01-13 | Discharge: 2014-01-13 | Disposition: A | Discharge status: Home / Self Care | Payer: Medicare Other | Source: Ambulatory Visit | Attending: Neurology | Admitting: Neurology

## 2014-01-13 DIAGNOSIS — M545 Low back pain, unspecified: Secondary | ICD-10-CM

## 2014-01-13 DIAGNOSIS — G8929 Other chronic pain: Secondary | ICD-10-CM

## 2014-01-17 ENCOUNTER — Telehealth: Payer: Self-pay | Admitting: Neurology

## 2014-01-17 NOTE — Telephone Encounter (Signed)
I called the patient. MRI of the low back shows some arthritis, nothing surgical. Patient needs to loose weight, and consider a chiropractor for the back.    MRI lumbar 01/13/14:  IMPRESSION:  Abnormal MRI lumbar spine (without) demonstrating: 1. Multi-level facet hypertrophy and minimal disc bulging. 2. No spinal stenosis or foraminal narrowing. 3. No significant change from MRI on 11/09/10.

## 2014-01-28 ENCOUNTER — Encounter: Payer: Self-pay | Admitting: Neurology

## 2014-05-04 ENCOUNTER — Telehealth: Payer: Self-pay

## 2014-05-04 ENCOUNTER — Ambulatory Visit: Payer: Medicare Other | Admitting: Neurology

## 2014-05-04 NOTE — Telephone Encounter (Signed)
Patient did not come to a follow up appointment. 

## 2014-05-06 ENCOUNTER — Encounter: Payer: Self-pay | Admitting: Neurology
